# Patient Record
Sex: Male | Born: 1935 | Race: White | Hispanic: No | Marital: Married | Smoking: Former smoker
Health system: Southern US, Community
[De-identification: ages and names within clinical notes are randomized; demographics above are authoritative.]

## PROBLEM LIST (undated history)

## (undated) DIAGNOSIS — M549 Dorsalgia, unspecified: Secondary | ICD-10-CM

## (undated) DIAGNOSIS — R413 Other amnesia: Secondary | ICD-10-CM

## (undated) DIAGNOSIS — I10 Essential (primary) hypertension: Secondary | ICD-10-CM

## (undated) DIAGNOSIS — N200 Calculus of kidney: Secondary | ICD-10-CM

## (undated) HISTORY — PX: AORTIC VALVE REPLACEMENT: SHX41

## (undated) HISTORY — PX: PROSTATE SURGERY: SHX751

## (undated) HISTORY — PX: KIDNEY STONE SURGERY: SHX686

---

## 2016-10-23 ENCOUNTER — Encounter (HOSPITAL_BASED_OUTPATIENT_CLINIC_OR_DEPARTMENT_OTHER): Payer: Self-pay

## 2016-10-23 ENCOUNTER — Emergency Department (HOSPITAL_BASED_OUTPATIENT_CLINIC_OR_DEPARTMENT_OTHER)
Admission: EM | Admit: 2016-10-23 | Discharge: 2016-10-23 | Disposition: A | Discharge status: Home / Self Care | Payer: Medicare Other | Attending: Emergency Medicine | Admitting: Emergency Medicine

## 2016-10-23 ENCOUNTER — Emergency Department (HOSPITAL_BASED_OUTPATIENT_CLINIC_OR_DEPARTMENT_OTHER): Payer: Medicare Other

## 2016-10-23 DIAGNOSIS — I10 Essential (primary) hypertension: Secondary | ICD-10-CM | POA: Diagnosis not present

## 2016-10-23 DIAGNOSIS — G8929 Other chronic pain: Secondary | ICD-10-CM | POA: Diagnosis not present

## 2016-10-23 DIAGNOSIS — M545 Low back pain, unspecified: Secondary | ICD-10-CM

## 2016-10-23 DIAGNOSIS — Z87891 Personal history of nicotine dependence: Secondary | ICD-10-CM | POA: Diagnosis not present

## 2016-10-23 DIAGNOSIS — R1030 Lower abdominal pain, unspecified: Secondary | ICD-10-CM | POA: Insufficient documentation

## 2016-10-23 DIAGNOSIS — R609 Edema, unspecified: Secondary | ICD-10-CM | POA: Insufficient documentation

## 2016-10-23 HISTORY — DX: Other amnesia: R41.3

## 2016-10-23 HISTORY — DX: Calculus of kidney: N20.0

## 2016-10-23 HISTORY — DX: Essential (primary) hypertension: I10

## 2016-10-23 HISTORY — DX: Dorsalgia, unspecified: M54.9

## 2016-10-23 LAB — URINALYSIS, ROUTINE W REFLEX MICROSCOPIC
GLUCOSE, UA: NEGATIVE mg/dL
HGB URINE DIPSTICK: NEGATIVE
Ketones, ur: NEGATIVE mg/dL
Leukocytes, UA: NEGATIVE
Nitrite: NEGATIVE
PROTEIN: NEGATIVE mg/dL
Specific Gravity, Urine: 1.019 (ref 1.005–1.030)
pH: 6.5 (ref 5.0–8.0)

## 2016-10-23 LAB — COMPREHENSIVE METABOLIC PANEL
ALT: 8 U/L — AB (ref 17–63)
AST: 16 U/L (ref 15–41)
Albumin: 3.3 g/dL — ABNORMAL LOW (ref 3.5–5.0)
Alkaline Phosphatase: 68 U/L (ref 38–126)
Anion gap: 4 — ABNORMAL LOW (ref 5–15)
BUN: 20 mg/dL (ref 6–20)
CHLORIDE: 104 mmol/L (ref 101–111)
CO2: 33 mmol/L — ABNORMAL HIGH (ref 22–32)
CREATININE: 1.22 mg/dL (ref 0.61–1.24)
Calcium: 8.5 mg/dL — ABNORMAL LOW (ref 8.9–10.3)
GFR calc non Af Amer: 51 mL/min — ABNORMAL LOW (ref >60)
GFR, EST AFRICAN AMERICAN: 59 mL/min — AB (ref >60)
Glucose, Bld: 102 mg/dL — ABNORMAL HIGH (ref 65–99)
Potassium: 3.9 mmol/L (ref 3.5–5.1)
Sodium: 141 mmol/L (ref 135–145)
Total Bilirubin: 0.4 mg/dL (ref 0.3–1.2)
Total Protein: 5.9 g/dL — ABNORMAL LOW (ref 6.5–8.1)

## 2016-10-23 LAB — CBC WITH DIFFERENTIAL/PLATELET
BASOS ABS: 0.1 10*3/uL (ref 0.0–0.1)
BASOS PCT: 1 %
EOS ABS: 0.1 10*3/uL (ref 0.0–0.7)
Eosinophils Relative: 1 %
HCT: 37.1 % — ABNORMAL LOW (ref 39.0–52.0)
HEMOGLOBIN: 12.2 g/dL — AB (ref 13.0–17.0)
LYMPHS ABS: 2 10*3/uL (ref 0.7–4.0)
Lymphocytes Relative: 22 %
MCH: 29.4 pg (ref 26.0–34.0)
MCHC: 32.9 g/dL (ref 30.0–36.0)
MCV: 89.4 fL (ref 78.0–100.0)
Monocytes Absolute: 1.1 10*3/uL — ABNORMAL HIGH (ref 0.1–1.0)
Monocytes Relative: 12 %
Neutro Abs: 5.8 10*3/uL (ref 1.7–7.7)
Neutrophils Relative %: 64 %
Platelets: 186 10*3/uL (ref 150–400)
RBC: 4.15 MIL/uL — AB (ref 4.22–5.81)
RDW: 15.9 % — ABNORMAL HIGH (ref 11.5–15.5)
WBC: 9 10*3/uL (ref 4.0–10.5)

## 2016-10-23 MED ORDER — HYDROCODONE-ACETAMINOPHEN 5-325 MG PO TABS: 0.5000 | ORAL_TABLET | Freq: Three times a day (TID) | ORAL | 0 refills | Status: AC | PRN

## 2016-10-23 MED ORDER — IBUPROFEN 400 MG PO TABS
400.0000 mg | ORAL_TABLET | Freq: Once | ORAL | Status: AC
  Administered 2016-10-23: 400 mg via ORAL
  Filled 2016-10-23: qty 1

## 2016-10-23 MED ORDER — HYDROCODONE-ACETAMINOPHEN 5-325 MG PO TABS
0.5000 | ORAL_TABLET | Freq: Once | ORAL | Status: AC
  Administered 2016-10-23: 0.5 via ORAL
  Filled 2016-10-23: qty 1

## 2016-10-23 NOTE — ED Notes (Signed)
Pt states he is unable to ambulate due to pain, MD notified

## 2016-10-23 NOTE — ED Triage Notes (Signed)
C/o lower back pain x 4 days-hx of chronic back pain-presents to triage in w/c-NAD

## 2016-10-23 NOTE — ED Notes (Signed)
Pts family members stated he can stand, however, to walk the pain is to much per family member. The pain is less when he is seating per family member. Pt place in wheel chair, RN Rosalita ChessmanSuzanne informed.

## 2016-10-23 NOTE — ED Notes (Signed)
Patient transported to CT 

## 2016-10-23 NOTE — ED Provider Notes (Signed)
[facility]   CSN: [id number] Arrival date & time: 10/23/16  1649     History   Chief Complaint Chief Complaint  Patient presents with  . Back Pain    HPI Guy Schroeder is a 83 y.o. male.  The history is provided by the patient and the spouse. No language interpreter was used.  Back Pain      Guy Schroeder is a [age] y.o. male who presents to the Emergency Department complaining of back pain.  He has a history of chronic back pain and has a history of joint injections, last injection one year ago. Over the last 3-4 days he's had increased pain in his low back bilaterally. The pain is constant and nonradiating. It occasionally improves with positional changes but is worse with movement. No fevers, nausea, vomiting, abdominal pain, constipation, diarrhea, dysuria. No numbness or weakness in the legs. No recent illnesses.  Past Medical History:  Diagnosis Date  . Back pain   . Hypertension   . Kidney stone   . Memory loss     There are no active problems to display for this patient.   Past Surgical History:  Procedure Laterality Date  . AORTIC VALVE REPLACEMENT    . KIDNEY STONE SURGERY    . PROSTATE SURGERY         Home Medications    Prior to Admission medications   Not on File    Family History No family history on file.  Social History Social History  Substance Use Topics  . Smoking status: Former Games developermoker  . Smokeless tobacco: Never Used  . Alcohol use No     Allergies   Codeine and Other   Review of Systems Review of Systems  Musculoskeletal: Positive for back pain.  All other systems reviewed and are negative.    Physical Exam Updated Vital Signs BP 152/68 (BP Location: Right Arm)   Pulse 60   Temp 98.1 F (36.7 C) (Oral)   Resp 18   Ht 5\' 4"  (1.626 m)   Wt 140 lb (63.5 kg)   SpO2 96%   BMI 24.03 kg/m   Physical Exam  Constitutional: He is oriented to person, place, and time. He appears well-developed and  well-nourished.  HENT:  Head: Normocephalic and atraumatic.  Cardiovascular: Normal rate and regular rhythm.   No murmur heard. Pulmonary/Chest: Effort normal and breath sounds normal. No respiratory distress.  Abdominal: Soft. There is no rebound and no guarding.  Mild lower abdominal tenderness  Musculoskeletal: He exhibits no tenderness.  Trace pitting edema to BLE.  2+ DP pulses bilaterally  Neurological: He is alert and oriented to person, place, and time.  5 out of 5 strength in all 4 extremities. Pain in the low back is reproducible with raising of the left lower extremity but there are no radicular symptoms. Sensation to light touch intact in bilateral lower extremities.  Skin: Skin is warm and dry.  Psychiatric: He has a normal mood and affect. His behavior is normal.  Nursing note and vitals reviewed.    ED Treatments / Results  Labs (all labs ordered are listed, but only abnormal results are displayed) Labs Reviewed - No data to display  EKG  EKG Interpretation None       Radiology No results found.  Procedures Procedures (including critical care time)  Medications Ordered in ED Medications  HYDROcodone-acetaminophen (NORCO/VICODIN) 5-325 MG per tablet 0.5 tablet (not administered)     Initial Impression / Assessment  and Plan / ED Course  I have reviewed the triage vital signs and the nursing notes.  Pertinent labs & imaging results that were available during my care of the patient were reviewed by me and considered in my medical decision making (see chart for details).     Patient here for evaluation of chronic low back pain here with worsening of his pain. He had no significant change in his pain following a half of a 5 mg hydrocodone tablet. Labs are near his baseline. Given his ongoing pain as CT was obtained that was negative for stone, obstruction, mass. Following additional treatment with hydrocodone and his pain was improved although he did have  some pain upon standing. Plan to DC home with close outpatient follow-up. No evidence of acute infection, epidural abscess, cauda equina.  Final Clinical Impressions(s) / ED Diagnoses   Final diagnoses:  Chronic bilateral low back pain without sciatica    New Prescriptions New Prescriptions   No medications on file     Tilden Fossa, MD 10/24/16 0013

## 2018-09-20 DEATH — deceased

## 2018-09-27 IMAGING — CT CT ABD-PELV W/O CM
2 of 4 series · 16 of 46 positions shown, 18 images · non-contrast
Comparison: None.

CLINICAL DATA: Low back pain for 4 days

EXAM:
CT ABDOMEN AND PELVIS WITHOUT CONTRAST
TECHNIQUE: Multidetector CT imaging of the abdomen and pelvis was performed
following the standard protocol without IV contrast.

[Series 2: axial st · axial · 0.76mm/px · z∈[-491,-106]mm · 13 of 85 slices shown, 15 images]
[im 4/85  soft-tissue]
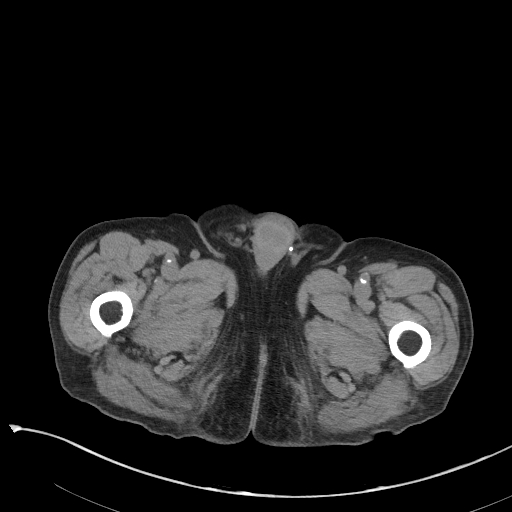
[im 4/85  bone]
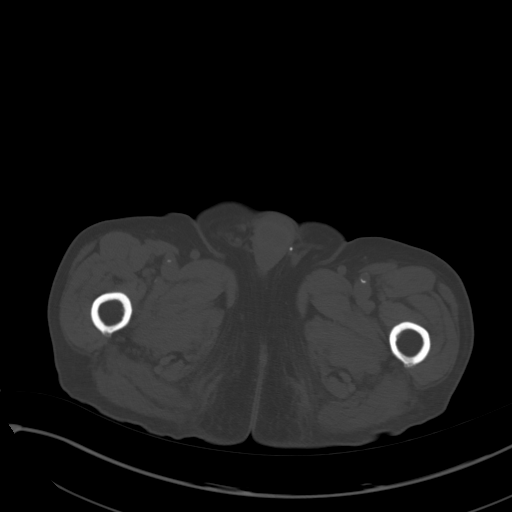
[im 10/85  soft-tissue]
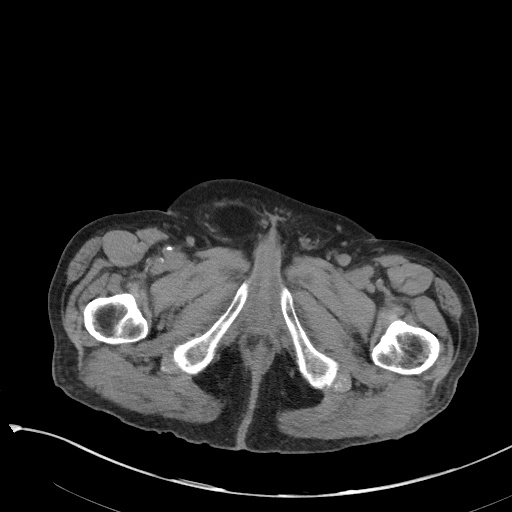
[im 17/85  soft-tissue]
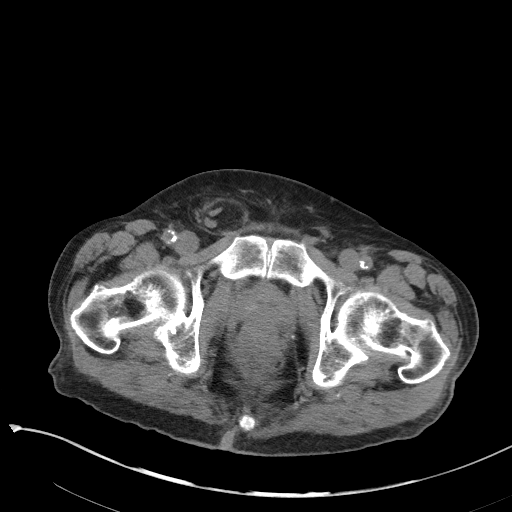
[im 23/85  soft-tissue]
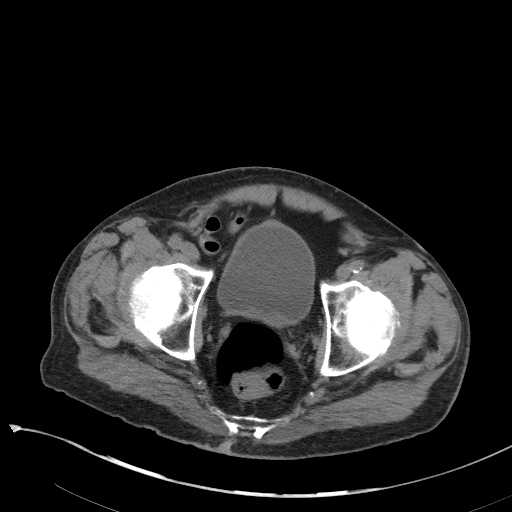
[im 30/85  soft-tissue]
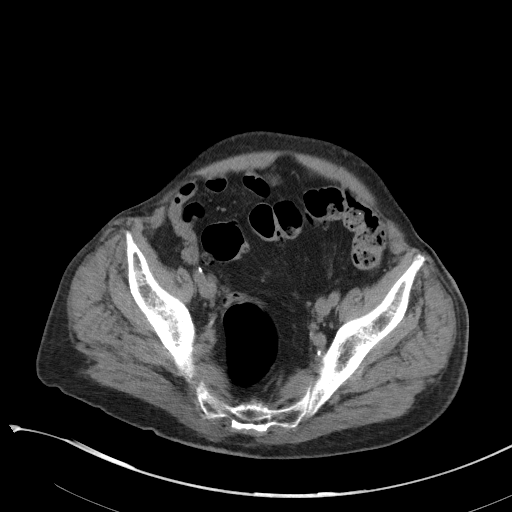
[im 36/85  soft-tissue]
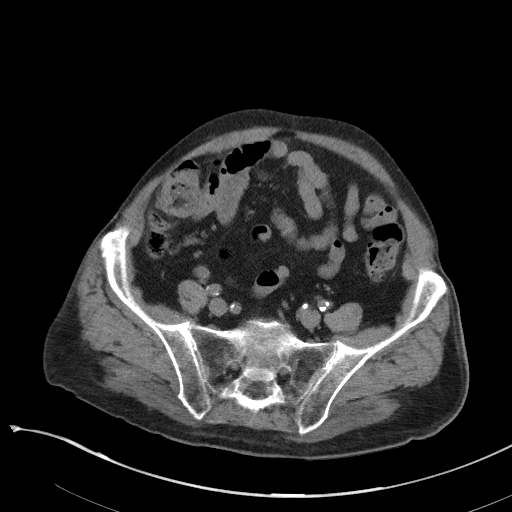
[im 43/85  soft-tissue]
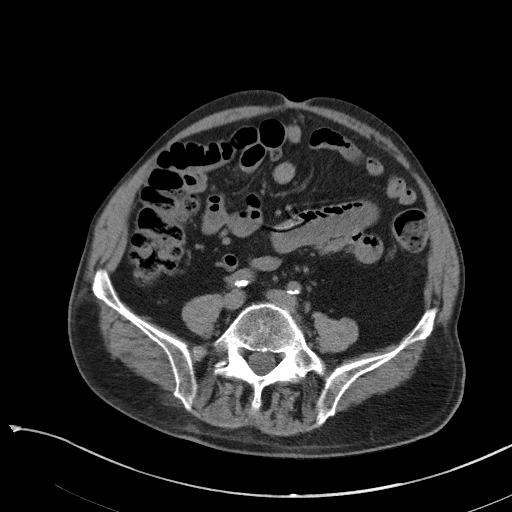
[im 49/85  soft-tissue]
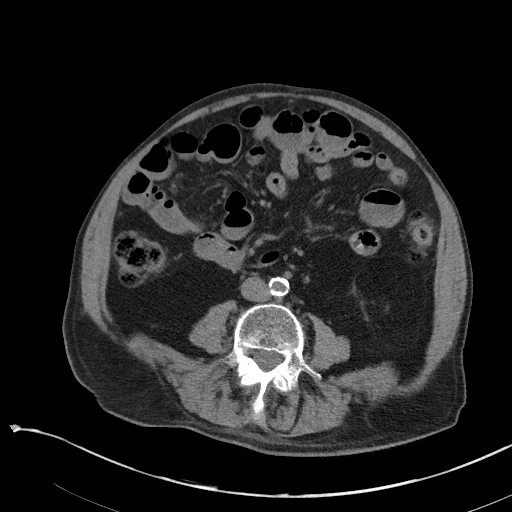
[im 55/85  soft-tissue]
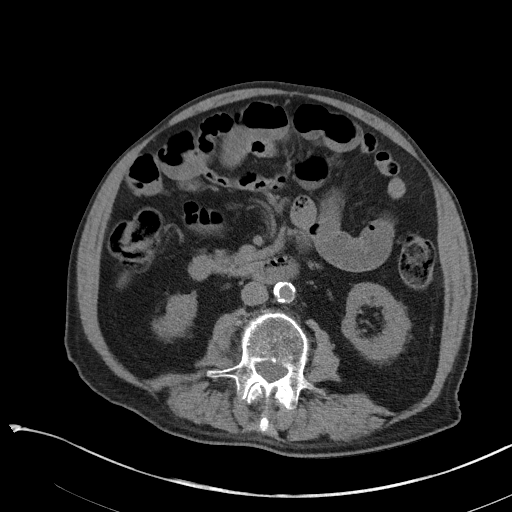
[im 55/85  bone]
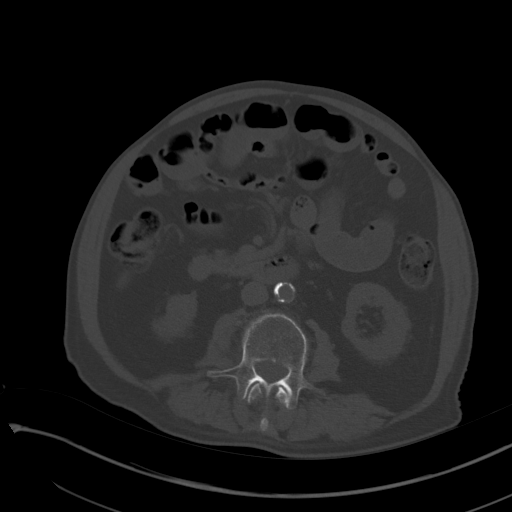
[im 62/85  soft-tissue]
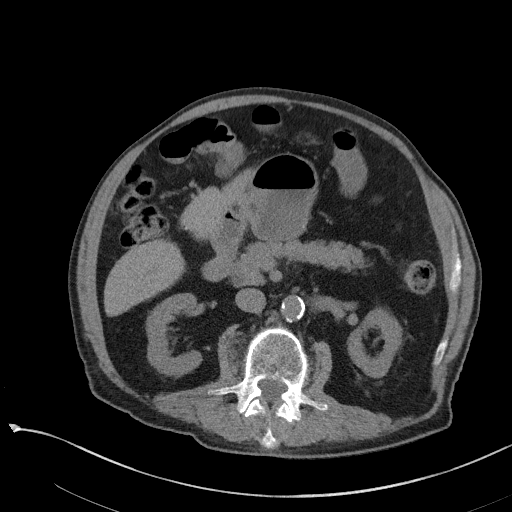
[im 68/85  soft-tissue]
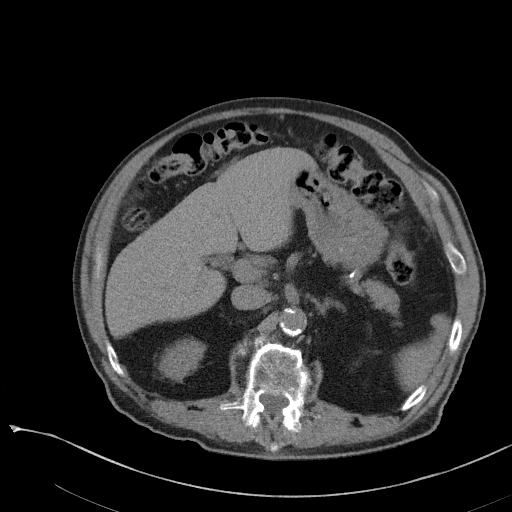
[im 75/85  soft-tissue]
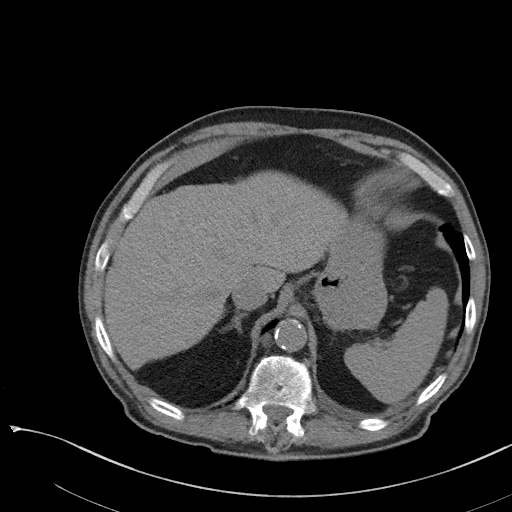
[im 81/85  soft-tissue]
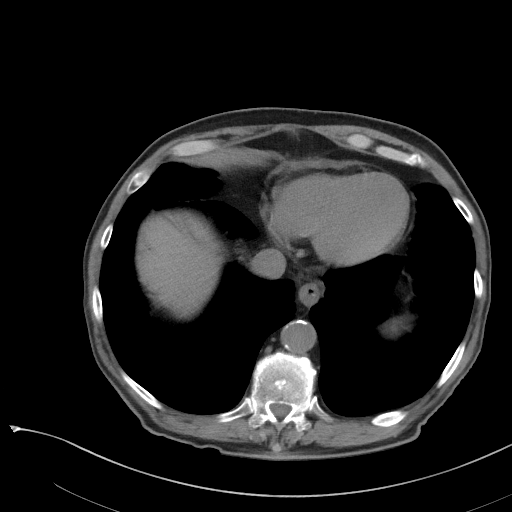

[Series 5: coronal st · coronal · 0.78mm/px · 3 of 101 slices shown]
[im 34/101  soft-tissue]
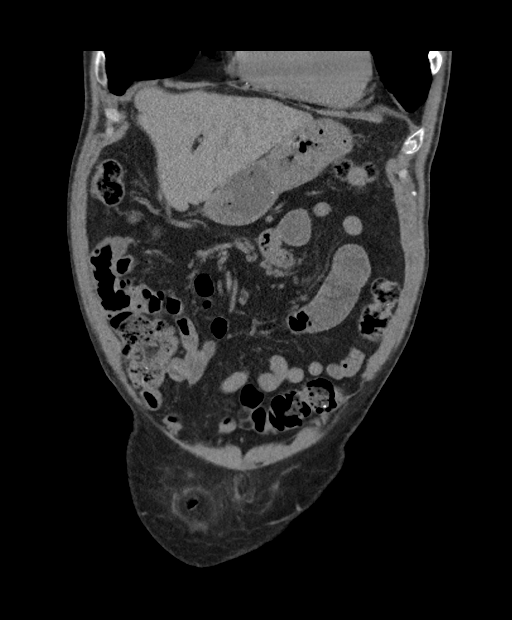
[im 45/101  soft-tissue]
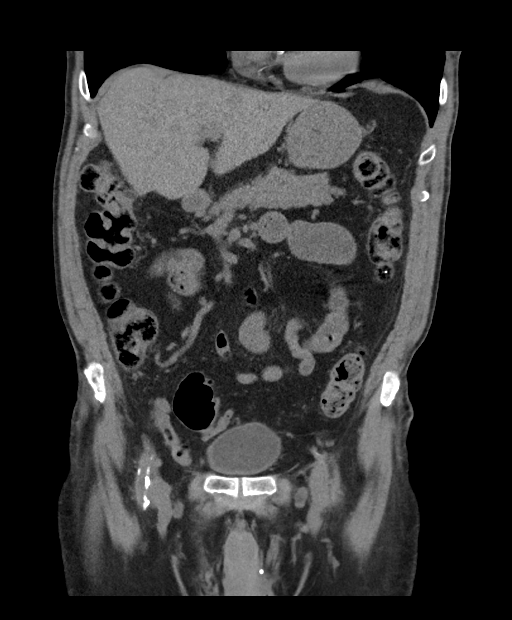
[im 56/101  soft-tissue]
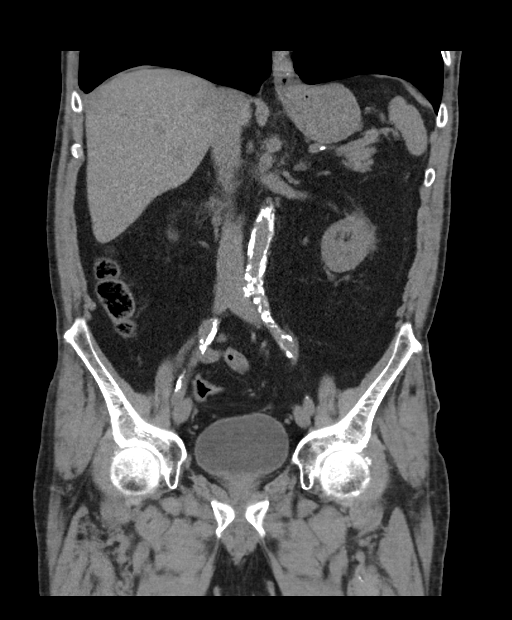

[16 of 46 positions shown; findings below may reference images not displayed]

FINDINGS: Lower chest: Partially calcified right subpleural lower lobe
pulmonary nodule, likely a small granuloma. No acute consolidation
or effusion. Small amount of pericardial effusion.

Hepatobiliary: No focal liver abnormality is seen. Status post
cholecystectomy. No intra hepatic biliary dilatation. Prominent
extrahepatic common bile duct measuring up to 12 mm, likely due to
postsurgical changes. Coarse calcification in the right posterior
hepatic lobe.

Pancreas: Unremarkable. No pancreatic ductal dilatation or
surrounding inflammatory changes.

Spleen: Normal in size without focal abnormality.

Adrenals/Urinary Tract: Adrenal glands are within normal limits. No
hydronephrosis. Punctate nonobstructing stone lower pole right
kidney. No ureteral stones. Bladder cannot be separated from the
prostate gland posteriorly.

Stomach/Bowel: Stomach is within normal limits. Appendix appears
normal. No evidence of bowel wall thickening, distention, or
inflammatory changes. Sigmoid colon diverticular disease without
acute inflammation

Vascular/Lymphatic: Aortic atherosclerosis. No enlarged abdominal or
pelvic lymph nodes.

Reproductive: Irregular enlargement of the prostate gland, prostate
gland cannot be separated from the posterior wall of the bladder.

Other: No free air or free fluid. There is a right inguinal hernia
containing fat and small bowel.

Musculoskeletal: There are degenerative changes of the spine.
Minimal retro listhesis of L3 on L4. No acute or suspicious bone
lesion.
IMPRESSION: 1. Punctate nonobstructing stone in the right kidney.
2. Enlarged irregular prostate gland. The prostate gland cannot be
separated from the posterior wall of the bladder and infiltration of
the bladder wall cannot be excluded. Recommend correlation with PSA,
consider cystoscopy for further evaluation.
3. Right inguinal hernia containing mesentery and small bowel. No
evidence for a bowel obstruction.
4. Trace pericardial effusion
